# Patient Record
Sex: Female | Born: 1960 | Race: White | Hispanic: No | Marital: Married | Smoking: Never smoker
Health system: Southern US, Community
[De-identification: ages and names within clinical notes are randomized; demographics above are authoritative.]

## PROBLEM LIST (undated history)

## (undated) DIAGNOSIS — E7800 Pure hypercholesterolemia, unspecified: Secondary | ICD-10-CM

## (undated) DIAGNOSIS — C801 Malignant (primary) neoplasm, unspecified: Secondary | ICD-10-CM

## (undated) HISTORY — PX: BREAST LUMPECTOMY: SHX2

## (undated) HISTORY — PX: TONSILLECTOMY: SUR1361

---

## 2016-05-13 ENCOUNTER — Emergency Department (HOSPITAL_BASED_OUTPATIENT_CLINIC_OR_DEPARTMENT_OTHER): Payer: Commercial Managed Care - PPO

## 2016-05-13 ENCOUNTER — Emergency Department (HOSPITAL_BASED_OUTPATIENT_CLINIC_OR_DEPARTMENT_OTHER)
Admission: EM | Admit: 2016-05-13 | Discharge: 2016-05-13 | Disposition: A | Discharge status: Home / Self Care | Payer: Commercial Managed Care - PPO | Attending: Emergency Medicine | Admitting: Emergency Medicine

## 2016-05-13 ENCOUNTER — Encounter (HOSPITAL_BASED_OUTPATIENT_CLINIC_OR_DEPARTMENT_OTHER): Payer: Self-pay | Admitting: Emergency Medicine

## 2016-05-13 DIAGNOSIS — W19XXXA Unspecified fall, initial encounter: Secondary | ICD-10-CM

## 2016-05-13 DIAGNOSIS — Y999 Unspecified external cause status: Secondary | ICD-10-CM | POA: Insufficient documentation

## 2016-05-13 DIAGNOSIS — S8991XA Unspecified injury of right lower leg, initial encounter: Secondary | ICD-10-CM | POA: Diagnosis present

## 2016-05-13 DIAGNOSIS — M25511 Pain in right shoulder: Secondary | ICD-10-CM

## 2016-05-13 DIAGNOSIS — Z79899 Other long term (current) drug therapy: Secondary | ICD-10-CM | POA: Diagnosis not present

## 2016-05-13 DIAGNOSIS — W010XXA Fall on same level from slipping, tripping and stumbling without subsequent striking against object, initial encounter: Secondary | ICD-10-CM | POA: Diagnosis not present

## 2016-05-13 DIAGNOSIS — Y939 Activity, unspecified: Secondary | ICD-10-CM | POA: Insufficient documentation

## 2016-05-13 DIAGNOSIS — M25561 Pain in right knee: Secondary | ICD-10-CM | POA: Diagnosis not present

## 2016-05-13 DIAGNOSIS — Y929 Unspecified place or not applicable: Secondary | ICD-10-CM | POA: Insufficient documentation

## 2016-05-13 HISTORY — DX: Pure hypercholesterolemia, unspecified: E78.00

## 2016-05-13 HISTORY — DX: Malignant (primary) neoplasm, unspecified: C80.1

## 2016-05-13 NOTE — Discharge Instructions (Signed)
Your x-ray showed no obvious signs of fracture. Please rest, ice, elevate your right arm and right leg. Tylenol and ibuprofen for pain. Please follow-up with your primary care doctor in one week if your symptoms are not improving for possibility of a missed fracture for further imaging. You may also follow-up with an orthopedist if symptoms are not improved for possible tendon or muscle abnormality in the right shoulder.

## 2016-05-13 NOTE — ED Provider Notes (Signed)
[facility] Provider Note   CSN: [id number] Arrival date & time: 05/13/16  0913     History   Chief Complaint Chief Complaint  Patient presents with  . Fall    HPI Tasha Wood is a 56 y.o. female.  56 year old Caucasian female with no significant past medical history presents to the ED today with complaint of right shoulder and right knee pain. Patient states that she slipped on the ice approximately 2 days ago. She fell onto her right arm and right knee. She denies hitting her head. She denies LOC. Patient states that the pain has improved from the initial onset however it continues to persist. She's been taking Tylenol and Aleve at home with some relief. She's been icing and elevating her right arm with little relief. Moving makes the pain worse. Nothing makes the pain better. States she felt like it was a muscle sprain or first of the pain has continued. She denies any weakness, paresthesias, abrasions, ecchymosis, erythema, edema. Patient has been ambulatory since the event. Denies any headache, neck pain, vision changes, back pain      Past Medical History:  Diagnosis Date  . Cancer ([place])   . High cholesterol     There are no active problems to display for this patient.   Past Surgical History:  Procedure Laterality Date  . BREAST LUMPECTOMY    . TONSILLECTOMY      OB History    No data available       Home Medications    Prior to Admission medications   Medication Sig Start Date End Date Taking? Authorizing Provider  atorvastatin (LIPITOR) 10 MG tablet Take 10 mg by mouth daily.   Yes Historical Provider, MD  calcium gluconate 500 MG tablet Take 1 tablet by mouth 3 (three) times daily.   Yes Historical Provider, MD  exemestane (AROMASIN) 25 MG tablet Take 1 mg by mouth daily after breakfast.   Yes Historical Provider, MD    Family History No family history on file.  Social History Social History  Substance Use Topics  . Smoking status:  Never Smoker  . Smokeless tobacco: Never Used  . Alcohol use No     Allergies   Patient has no known allergies.   Review of Systems Review of Systems  Constitutional: Negative for chills and fever.  Gastrointestinal: Negative for nausea and vomiting.  Musculoskeletal: Positive for arthralgias. Negative for back pain, gait problem, joint swelling and neck pain.  Skin: Negative for color change and wound.  Neurological: Negative for dizziness, syncope, weakness, numbness and headaches.  All other systems reviewed and are negative.    Physical Exam Updated Vital Signs BP 145/75 (BP Location: Right Arm)   Pulse 87   Temp 98.2 F (36.8 C) (Oral)   Resp 18   Ht 5\' 5"  (1.651 m)   Wt 99.8 kg   SpO2 99%   BMI 36.61 kg/m   Physical Exam  Constitutional: She appears well-developed and well-nourished. No distress.  Eyes: Conjunctivae and EOM are normal. Pupils are equal, round, and reactive to light. Right eye exhibits no discharge. Left eye exhibits no discharge. No scleral icterus.  Neck: Normal range of motion. Neck supple.  No midline C-spine tenderness. Full range of motion. No deformities or step-offs noted.  Pulmonary/Chest: No respiratory distress.  Musculoskeletal: Normal range of motion.       Right shoulder: She exhibits tenderness, bony tenderness and pain. She exhibits normal range of motion, no swelling, no effusion,  no crepitus, no deformity, no laceration, no spasm, normal pulse and normal strength.       Right elbow: She exhibits normal range of motion, no swelling, no effusion and no deformity. Tenderness found. Radial head tenderness noted. No medial epicondyle, no lateral epicondyle and no olecranon process tenderness noted.       Right knee: She exhibits swelling, bony tenderness and MCL laxity. She exhibits normal range of motion, no effusion, no ecchymosis, no deformity, no laceration, no erythema, normal alignment, no LCL laxity, normal patellar mobility and  normal meniscus. Tenderness found. Lateral joint line tenderness noted. No patellar tendon tenderness noted.  Full range of motion of all extremities. Strength 5 out of 5 in all extremities. Sensation intact. Cap refill normal. Radial pulses are 2+ bilaterally. DP pulses are 2+ bilaterally. Negative neers or hawkins sign.  No midline L-spine and T-spine tenderness. No deformities or step-offs noted. No paraspinal tenderness. Full range of motion of lumbar spine.  Neurological: She is alert.  Skin: Skin is warm and dry. Capillary refill takes less than 2 seconds. No pallor.  Nursing note and vitals reviewed.    ED Treatments / Results  Labs (all labs ordered are listed, but only abnormal results are displayed) Labs Reviewed - No data to display  EKG  EKG Interpretation None       Radiology Dg Shoulder Right  Result Date: 05/13/2016 CLINICAL DATA:  Fall on ice 2 days ago. Right shoulder pain. Initial encounter. EXAM: RIGHT SHOULDER - 2+ VIEW COMPARISON:  None. FINDINGS: No acute fracture or dislocation identified. Minimal degenerative disease of the [facility] joint. No bony lesions or destruction. Soft tissues are unremarkable. IMPRESSION: No acute injury identified. Electronically Signed   By: Aletta Edouard M.D.   On: 05/13/2016 10:33   Dg Elbow Complete Right  Result Date: 05/13/2016 CLINICAL DATA:  Fall on ice 2 days ago. Right elbow pain. Initial encounter. EXAM: RIGHT ELBOW - COMPLETE 3+ VIEW COMPARISON:  None. FINDINGS: There is no evidence of fracture, dislocation, or joint effusion. There is no evidence of arthropathy or other focal bone abnormality. Soft tissues are unremarkable. IMPRESSION: Negative. Electronically Signed   By: Aletta Edouard M.D.   On: 05/13/2016 10:34   Dg Knee Complete 4 Views Right  Result Date: 05/13/2016 CLINICAL DATA:  Fall on ice 2 days ago. Right knee pain. Initial encounter. EXAM: RIGHT KNEE - COMPLETE 4+ VIEW COMPARISON:  None. FINDINGS: No evidence of  fracture, dislocation, or joint effusion. Mild tricompartmental osteoarthritis present. No bony lesions or destruction. Soft tissues are unremarkable. IMPRESSION: No acute injury identified. 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 Electronically Signed   By: Aletta Edouard M.D.   On: 05/13/2016 10:35    Procedures Procedures (including critical care time)  Medications Ordered in ED Medications - No data to display   Initial Impression / Assessment and Plan / ED Course  I have reviewed the triage vital signs and the nursing notes.  Pertinent labs & imaging results that were available during my care of the patient were reviewed by me and considered in my medical decision making (see chart for details).    The patient presents to the ED with right shoulder and right knee pain following mechanical fall on the ice 2 days ago. Pain has improved but persisted. Patient with full range of motion. Patient is neurovascularly intact. Strength is normal in all extremities. We'll obtain imaging to rule out any fractures. Patient X-Ray negative for obvious fracture or  dislocation. Pain managed in ED. Pt advised to follow up with orthopedics if symptoms persist for possibility of missed fracture diagnosis. Patient given sling while in ED, conservative therapy recommended and discussed. Patient will be dc home & is agreeable with above plan.    Final Clinical Impressions(s) / ED Diagnoses   Final diagnoses:  Fall, initial encounter  Acute pain of right shoulder  Acute pain of right knee    New Prescriptions New Prescriptions   No medications on file     Doristine Devoid, PA-C 05/13/16 [place], MD 05/13/16 1119

## 2016-05-13 NOTE — ED Triage Notes (Signed)
Pt slipped on ice this morning causing her to fall. Pt c/o R arm and R knee pain. Denies LOC.

## 2017-11-02 IMAGING — DX DG SHOULDER 2+V*R*
3 series · 3 of 3 positions shown · non-contrast
Comparison: None.

CLINICAL DATA: Fall on ice 2 days ago. Right shoulder pain. Initial
encounter.

EXAM:
RIGHT SHOULDER - 2+ VIEW

[shoulder grashey]
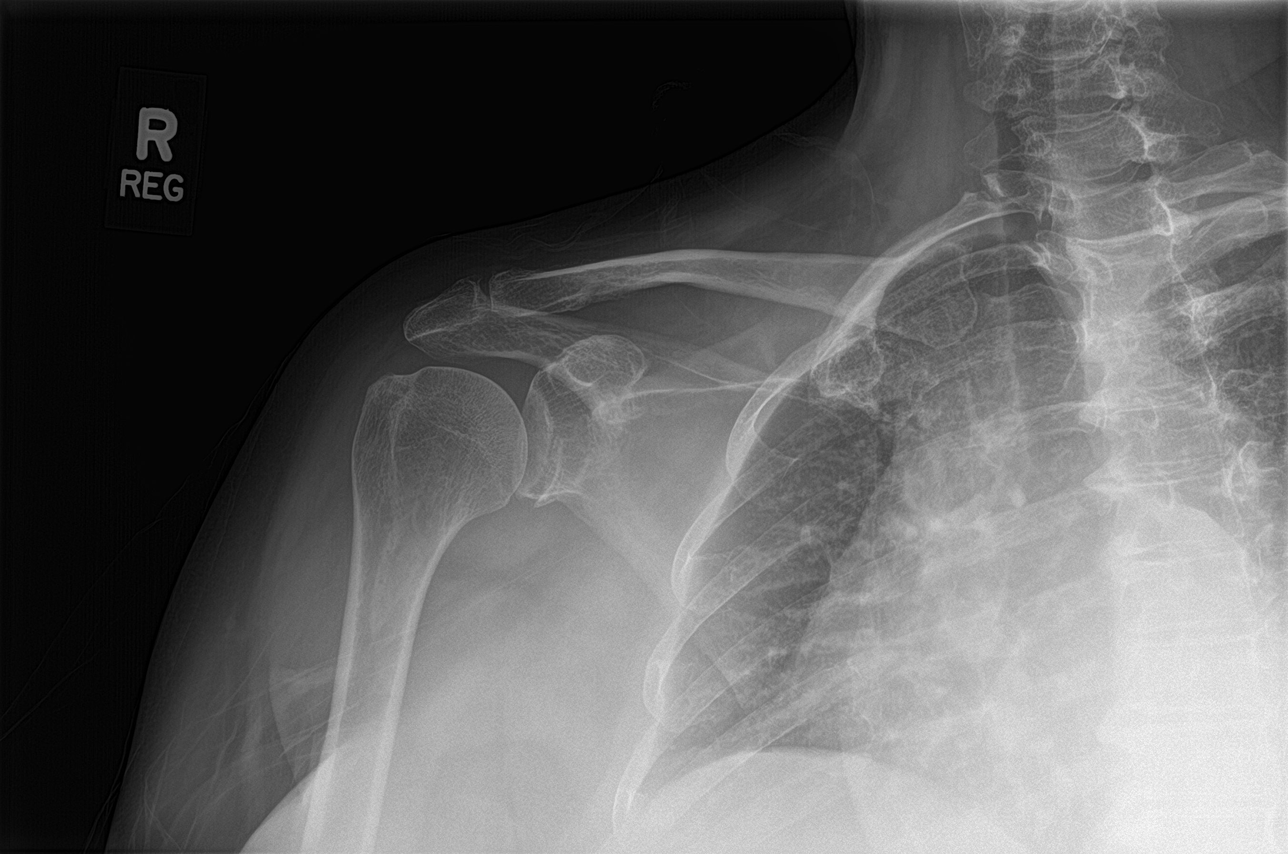

[shoulder y view]
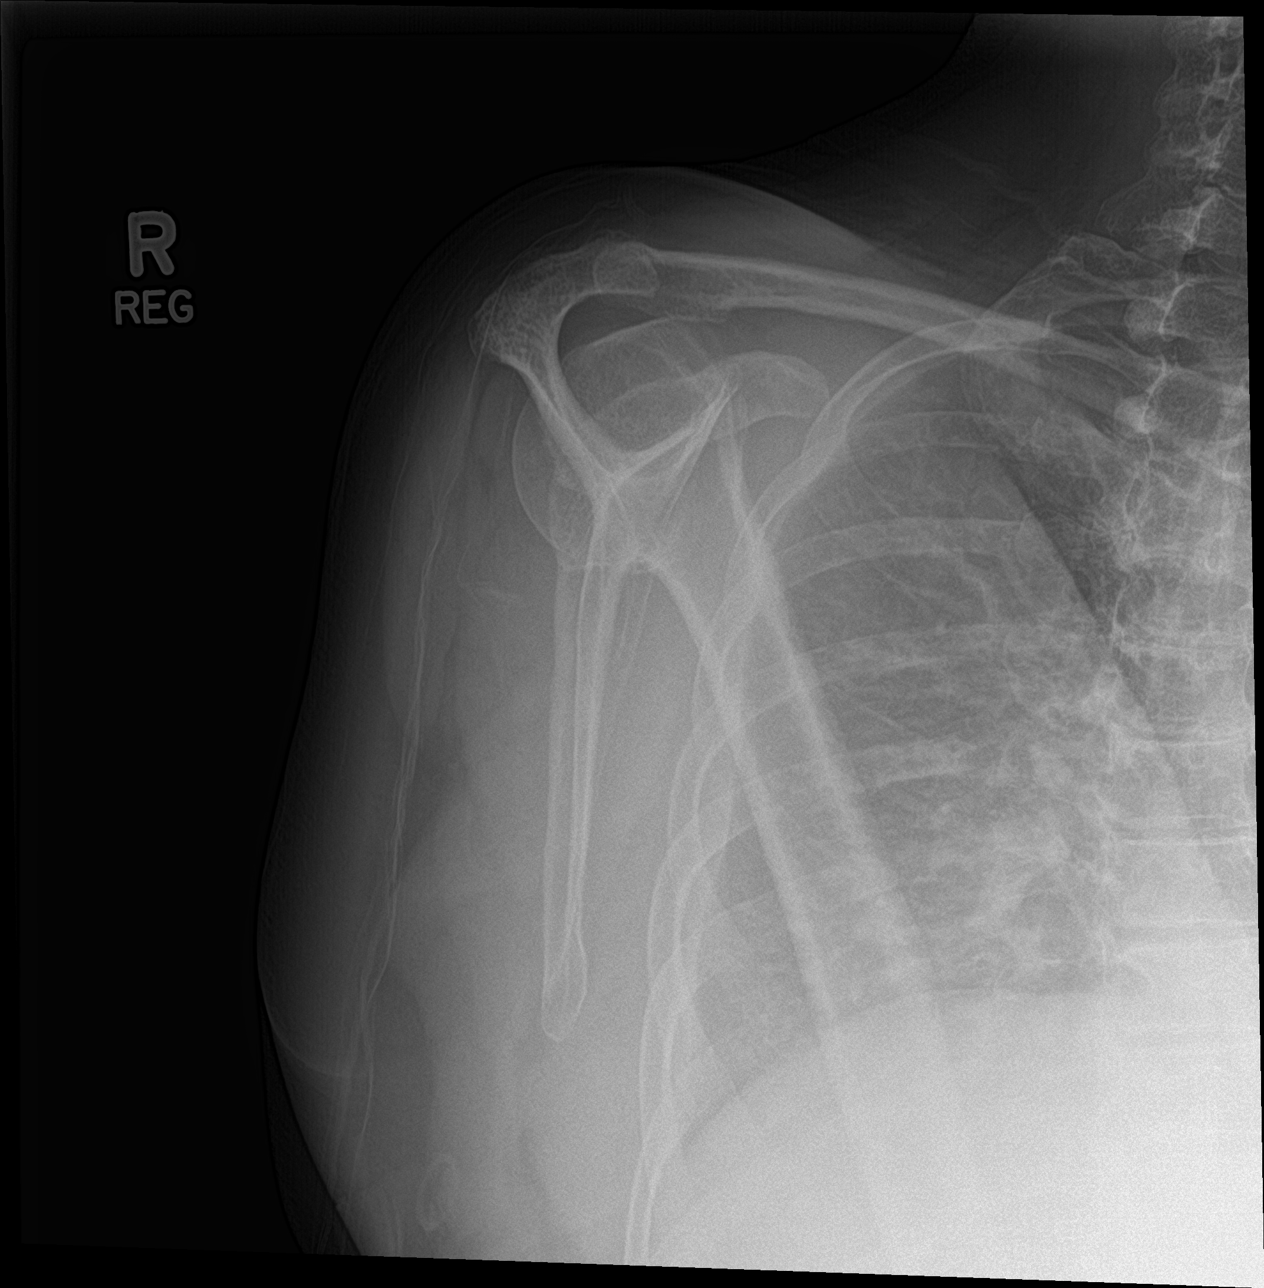

[shoulder axillary]
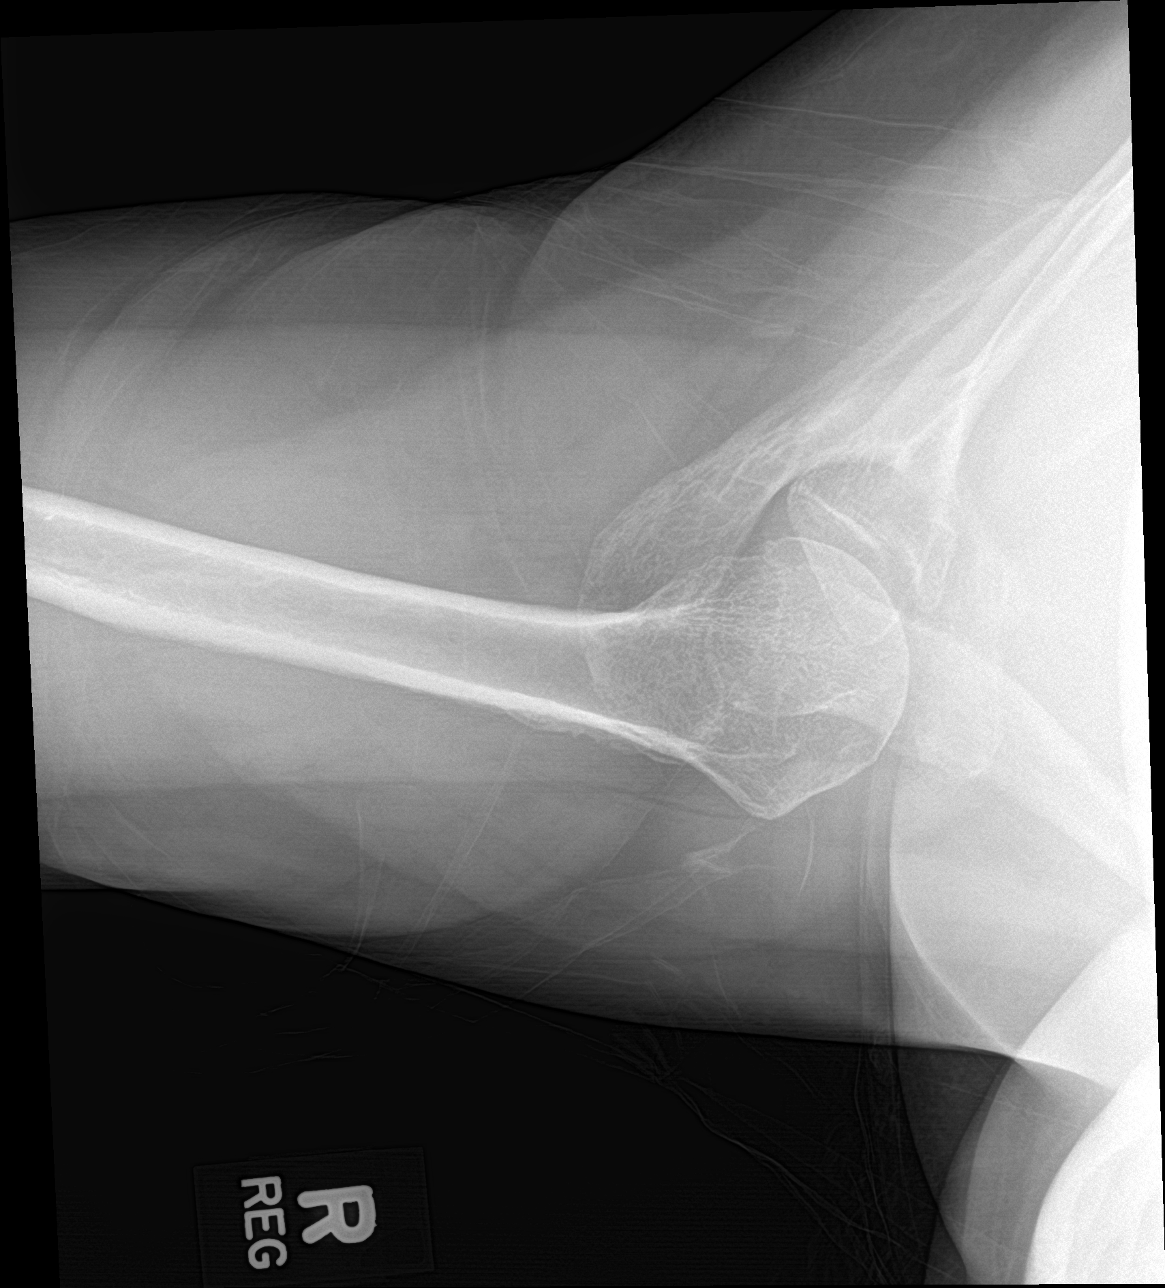

[3 of 3 positions shown; findings below may reference images not displayed]

FINDINGS: No acute fracture or dislocation identified. Minimal degenerative
disease of the AC joint. No bony lesions or destruction. Soft
tissues are unremarkable.
IMPRESSION: No acute injury identified.
